# Patient Record
Sex: Female | Born: 1945 | Race: White | Hispanic: No | Marital: Married | Smoking: Never smoker
Health system: Southern US, Community
[De-identification: ages and names within clinical notes are randomized; demographics above are authoritative.]

## PROBLEM LIST (undated history)

## (undated) DIAGNOSIS — C801 Malignant (primary) neoplasm, unspecified: Secondary | ICD-10-CM

## (undated) DIAGNOSIS — M1990 Unspecified osteoarthritis, unspecified site: Secondary | ICD-10-CM

## (undated) HISTORY — PX: COLONOSCOPY: SHX174

## (undated) HISTORY — PX: BREAST BIOPSY: SHX20

---

## 2004-08-16 ENCOUNTER — Ambulatory Visit: Payer: Self-pay | Admitting: Family Medicine

## 2004-09-02 ENCOUNTER — Ambulatory Visit: Payer: Self-pay | Admitting: Gastroenterology

## 2006-01-09 ENCOUNTER — Ambulatory Visit: Payer: Self-pay | Admitting: Pediatrics

## 2007-09-17 ENCOUNTER — Ambulatory Visit: Payer: Self-pay | Admitting: Family Medicine

## 2007-09-23 ENCOUNTER — Ambulatory Visit: Payer: Self-pay | Admitting: Family Medicine

## 2009-07-26 ENCOUNTER — Ambulatory Visit: Payer: Self-pay | Admitting: Family Medicine

## 2009-09-28 ENCOUNTER — Ambulatory Visit: Payer: Self-pay | Admitting: Gastroenterology

## 2011-06-07 ENCOUNTER — Ambulatory Visit: Payer: Self-pay | Admitting: Family Medicine

## 2012-09-24 ENCOUNTER — Ambulatory Visit: Payer: Self-pay | Admitting: Family Medicine

## 2012-11-20 ENCOUNTER — Ambulatory Visit: Payer: Self-pay | Admitting: Gastroenterology

## 2012-11-28 ENCOUNTER — Ambulatory Visit: Payer: Self-pay | Admitting: Family Medicine

## 2013-11-28 ENCOUNTER — Ambulatory Visit: Payer: Self-pay | Admitting: Family Medicine

## 2013-12-03 ENCOUNTER — Ambulatory Visit: Payer: Self-pay | Admitting: Family Medicine

## 2015-11-09 ENCOUNTER — Other Ambulatory Visit: Payer: Self-pay | Admitting: Family Medicine

## 2015-11-09 DIAGNOSIS — Z139 Encounter for screening, unspecified: Secondary | ICD-10-CM

## 2015-11-09 DIAGNOSIS — Z13820 Encounter for screening for osteoporosis: Secondary | ICD-10-CM

## 2015-12-02 ENCOUNTER — Ambulatory Visit
Admission: RE | Admit: 2015-12-02 | Discharge: 2015-12-02 | Disposition: A | Discharge status: Home / Self Care | Payer: Medicare Other | Source: Ambulatory Visit | Attending: Family Medicine | Admitting: Family Medicine

## 2015-12-02 ENCOUNTER — Other Ambulatory Visit: Payer: Self-pay | Admitting: Family Medicine

## 2015-12-02 DIAGNOSIS — Z13820 Encounter for screening for osteoporosis: Secondary | ICD-10-CM | POA: Diagnosis present

## 2015-12-02 DIAGNOSIS — Z1231 Encounter for screening mammogram for malignant neoplasm of breast: Secondary | ICD-10-CM | POA: Insufficient documentation

## 2015-12-02 DIAGNOSIS — Z139 Encounter for screening, unspecified: Secondary | ICD-10-CM

## 2015-12-02 HISTORY — DX: Malignant (primary) neoplasm, unspecified: C80.1

## 2017-03-29 ENCOUNTER — Other Ambulatory Visit: Payer: Self-pay | Admitting: Family Medicine

## 2017-03-29 DIAGNOSIS — Z1231 Encounter for screening mammogram for malignant neoplasm of breast: Secondary | ICD-10-CM

## 2017-04-13 ENCOUNTER — Ambulatory Visit
Admission: RE | Admit: 2017-04-13 | Discharge: 2017-04-13 | Disposition: A | Discharge status: Home / Self Care | Payer: Medicare Other | Source: Ambulatory Visit | Attending: Family Medicine | Admitting: Family Medicine

## 2017-04-13 DIAGNOSIS — Z1231 Encounter for screening mammogram for malignant neoplasm of breast: Secondary | ICD-10-CM | POA: Diagnosis present

## 2017-04-13 DIAGNOSIS — R928 Other abnormal and inconclusive findings on diagnostic imaging of breast: Secondary | ICD-10-CM | POA: Insufficient documentation

## 2017-04-17 ENCOUNTER — Other Ambulatory Visit: Payer: Self-pay | Admitting: Family Medicine

## 2017-04-17 DIAGNOSIS — R928 Other abnormal and inconclusive findings on diagnostic imaging of breast: Secondary | ICD-10-CM

## 2017-04-24 ENCOUNTER — Ambulatory Visit
Admission: RE | Admit: 2017-04-24 | Discharge: 2017-04-24 | Disposition: A | Discharge status: Home / Self Care | Payer: Medicare Other | Source: Ambulatory Visit | Attending: Family Medicine | Admitting: Family Medicine

## 2017-04-24 DIAGNOSIS — R928 Other abnormal and inconclusive findings on diagnostic imaging of breast: Secondary | ICD-10-CM

## 2017-05-09 ENCOUNTER — Encounter
Admission: RE | Admit: 2017-05-09 | Discharge: 2017-05-09 | Disposition: A | Discharge status: Home / Self Care | Payer: Medicare Other | Source: Ambulatory Visit | Attending: Obstetrics & Gynecology | Admitting: Obstetrics & Gynecology

## 2017-05-09 HISTORY — DX: Unspecified osteoarthritis, unspecified site: M19.90

## 2017-05-09 NOTE — Patient Instructions (Addendum)
  Your procedure is scheduled on: 05-11-17 FRIDAY Report to Same Day Surgery 2nd floor medical mall [facility] Entrance-take elevator on left to 2nd floor.  Check in with surgery information desk.) To find out your arrival time please call [phone] between 1PM - 3PM on 05-10-17 THURSDAY  Remember: Instructions that are not followed completely may result in serious medical risk, up to and including death, or upon the discretion of your surgeon and anesthesiologist your surgery may need to be rescheduled.    _x___ 1. Do not eat food after midnight the night before your procedure. NO GUM CHEWING OR HARD CANDIES.  You may drink clear liquids up to 2 hours before you are scheduled to arrive at the hospital for your procedure.  Do not drink clear liquids within 2 hours of your scheduled arrival to the hospital.  Clear liquids include  --Water or Apple juice without pulp  --Clear carbohydrate beverage such as ClearFast or Gatorade  --Black Coffee or Clear Tea (No milk, no creamers, do not add anything to the coffee or Tea)  Type 1 and type 2 diabetics should only drink water.     __x__ 2. No Alcohol for 24 hours before or after surgery.   __x__3. No Smoking for 24 prior to surgery.   ____  4. Bring all medications with you on the day of surgery if instructed.    __x__ 5. Notify your doctor if there is any change in your medical condition     (cold, fever, infections).     Do not wear jewelry, make-up, hairpins, clips or nail polish.  Do not wear lotions, powders, or perfumes. You may wear deodorant.  Do not shave 48 hours prior to surgery. Men may shave face and neck.  Do not bring valuables to the hospital.    [facility] is not responsible for any belongings or valuables.               Contacts, dentures or bridgework may not be worn into surgery.  Leave your suitcase in the car. After surgery it may be brought to your room.  For patients admitted to the hospital, discharge time is  determined by your treatment team.   Patients discharged the day of surgery will not be allowed to drive home.  You will need someone to drive you home and stay with you the night of your procedure.    Please read over the following fact sheets that you were given:   [facility] Preparing for Surgery and or MRSA Information   ____ Take anti-hypertensive listed below, cardiac, seizure, asthma, anti-reflux and psychiatric medicines. These include:  1. NONE  2.  3.  4.  5.  6.  ____Fleets enema or Magnesium Citrate as directed.   _x___ Use CHG Soap or sage wipes as directed on instruction sheet   ____ Use inhalers on the day of surgery and bring to hospital day of surgery  ____ Stop Metformin and Janumet 2 days prior to surgery.    ____ Take 1/2 of usual insulin dose the night before surgery and none on the morning surgery.   _x___ Follow recommendations from Cardiologist, Pulmonologist or PCP regarding stopping Aspirin, Coumadin, Plavix ,Eliquis, Effient, or Pradaxa, and Pletal-STOP ASPIRIN NOW  X____Stop Anti-inflammatories such as Advil, Aleve, Ibuprofen, Motrin, Naproxen, Naprosyn, Goodies powders or aspirin products NOW-OK to take Tylenol   ____ Stop supplements until after surgery.     ____ Bring C-Pap to the hospital.

## 2017-05-10 ENCOUNTER — Encounter
Admission: RE | Admit: 2017-05-10 | Discharge: 2017-05-10 | Disposition: A | Discharge status: Home / Self Care | Payer: Medicare Other | Source: Ambulatory Visit | Attending: Obstetrics & Gynecology | Admitting: Obstetrics & Gynecology

## 2017-05-10 DIAGNOSIS — Z7982 Long term (current) use of aspirin: Secondary | ICD-10-CM | POA: Diagnosis not present

## 2017-05-10 DIAGNOSIS — N950 Postmenopausal bleeding: Secondary | ICD-10-CM | POA: Diagnosis not present

## 2017-05-10 DIAGNOSIS — N854 Malposition of uterus: Secondary | ICD-10-CM | POA: Diagnosis not present

## 2017-05-10 DIAGNOSIS — E669 Obesity, unspecified: Secondary | ICD-10-CM | POA: Diagnosis not present

## 2017-05-10 DIAGNOSIS — Z803 Family history of malignant neoplasm of breast: Secondary | ICD-10-CM | POA: Diagnosis not present

## 2017-05-10 DIAGNOSIS — R9389 Abnormal findings on diagnostic imaging of other specified body structures: Secondary | ICD-10-CM | POA: Diagnosis present

## 2017-05-10 DIAGNOSIS — Z833 Family history of diabetes mellitus: Secondary | ICD-10-CM | POA: Diagnosis not present

## 2017-05-10 DIAGNOSIS — E7800 Pure hypercholesterolemia, unspecified: Secondary | ICD-10-CM | POA: Diagnosis not present

## 2017-05-10 DIAGNOSIS — Z6835 Body mass index (BMI) 35.0-35.9, adult: Secondary | ICD-10-CM | POA: Diagnosis not present

## 2017-05-10 DIAGNOSIS — M1990 Unspecified osteoarthritis, unspecified site: Secondary | ICD-10-CM | POA: Diagnosis not present

## 2017-05-10 DIAGNOSIS — Z79899 Other long term (current) drug therapy: Secondary | ICD-10-CM | POA: Diagnosis not present

## 2017-05-10 DIAGNOSIS — Z823 Family history of stroke: Secondary | ICD-10-CM | POA: Diagnosis not present

## 2017-05-10 DIAGNOSIS — N840 Polyp of corpus uteri: Secondary | ICD-10-CM | POA: Diagnosis not present

## 2017-05-10 LAB — TYPE AND SCREEN
ABO/RH(D): A POS
ANTIBODY SCREEN: NEGATIVE

## 2017-05-10 LAB — BASIC METABOLIC PANEL
ANION GAP: 8 (ref 5–15)
BUN: 19 mg/dL (ref 6–20)
CALCIUM: 8.9 mg/dL (ref 8.9–10.3)
CO2: 27 mmol/L (ref 22–32)
CREATININE: 0.82 mg/dL (ref 0.44–1.00)
Chloride: 107 mmol/L (ref 101–111)
Glucose, Bld: 96 mg/dL (ref 65–99)
Potassium: 3.9 mmol/L (ref 3.5–5.1)
Sodium: 142 mmol/L (ref 135–145)

## 2017-05-10 LAB — CBC
HCT: 42.6 % (ref 35.0–47.0)
HEMOGLOBIN: 14.5 g/dL (ref 12.0–16.0)
MCH: 29.3 pg (ref 26.0–34.0)
MCHC: 34 g/dL (ref 32.0–36.0)
MCV: 86.2 fL (ref 80.0–100.0)
PLATELETS: 208 10*3/uL (ref 150–440)
RBC: 4.95 MIL/uL (ref 3.80–5.20)
RDW: 13.7 % (ref 11.5–14.5)
WBC: 5.7 10*3/uL (ref 3.6–11.0)

## 2017-05-11 ENCOUNTER — Ambulatory Visit: Payer: Medicare Other | Admitting: Anesthesiology

## 2017-05-11 ENCOUNTER — Encounter
Admission: RE | Disposition: A | Discharge status: Home / Self Care | Payer: Self-pay | Source: Ambulatory Visit | Attending: Obstetrics & Gynecology

## 2017-05-11 ENCOUNTER — Ambulatory Visit
Admission: RE | Admit: 2017-05-11 | Discharge: 2017-05-11 | Disposition: A | Discharge status: Home / Self Care | Payer: Medicare Other | Source: Ambulatory Visit | Attending: Obstetrics & Gynecology | Admitting: Obstetrics & Gynecology

## 2017-05-11 ENCOUNTER — Encounter: Payer: Self-pay | Admitting: *Deleted

## 2017-05-11 DIAGNOSIS — Z79899 Other long term (current) drug therapy: Secondary | ICD-10-CM | POA: Insufficient documentation

## 2017-05-11 DIAGNOSIS — N840 Polyp of corpus uteri: Secondary | ICD-10-CM | POA: Diagnosis not present

## 2017-05-11 DIAGNOSIS — Z803 Family history of malignant neoplasm of breast: Secondary | ICD-10-CM | POA: Insufficient documentation

## 2017-05-11 DIAGNOSIS — Z833 Family history of diabetes mellitus: Secondary | ICD-10-CM | POA: Insufficient documentation

## 2017-05-11 DIAGNOSIS — N854 Malposition of uterus: Secondary | ICD-10-CM | POA: Insufficient documentation

## 2017-05-11 DIAGNOSIS — M1990 Unspecified osteoarthritis, unspecified site: Secondary | ICD-10-CM | POA: Insufficient documentation

## 2017-05-11 DIAGNOSIS — Z6835 Body mass index (BMI) 35.0-35.9, adult: Secondary | ICD-10-CM | POA: Insufficient documentation

## 2017-05-11 DIAGNOSIS — Z7982 Long term (current) use of aspirin: Secondary | ICD-10-CM | POA: Insufficient documentation

## 2017-05-11 DIAGNOSIS — E669 Obesity, unspecified: Secondary | ICD-10-CM | POA: Insufficient documentation

## 2017-05-11 DIAGNOSIS — E7800 Pure hypercholesterolemia, unspecified: Secondary | ICD-10-CM | POA: Insufficient documentation

## 2017-05-11 DIAGNOSIS — N950 Postmenopausal bleeding: Secondary | ICD-10-CM | POA: Insufficient documentation

## 2017-05-11 DIAGNOSIS — Z823 Family history of stroke: Secondary | ICD-10-CM | POA: Insufficient documentation

## 2017-05-11 HISTORY — PX: HYSTEROSCOPY WITH D & C: SHX1775

## 2017-05-11 SURGERY — DILATATION AND CURETTAGE /HYSTEROSCOPY
Anesthesia: General | Wound class: Clean Contaminated

## 2017-05-11 MED ORDER — PROPOFOL 10 MG/ML IV BOLUS
INTRAVENOUS | Status: AC
  Filled 2017-05-11: qty 20

## 2017-05-11 MED ORDER — FAMOTIDINE 20 MG PO TABS
ORAL_TABLET | ORAL | Status: AC
  Administered 2017-05-11: 20 mg via ORAL
  Filled 2017-05-11: qty 1

## 2017-05-11 MED ORDER — LACTATED RINGERS IV SOLN
INTRAVENOUS | Status: DC
  Administered 2017-05-11: 07:00:00 via INTRAVENOUS

## 2017-05-11 MED ORDER — DEXAMETHASONE SODIUM PHOSPHATE 10 MG/ML IJ SOLN
INTRAMUSCULAR | Status: AC
  Filled 2017-05-11: qty 2

## 2017-05-11 MED ORDER — GLYCOPYRROLATE 0.2 MG/ML IJ SOLN
INTRAMUSCULAR | Status: DC | PRN
  Administered 2017-05-11: 0.2 mg via INTRAVENOUS

## 2017-05-11 MED ORDER — DEXAMETHASONE SODIUM PHOSPHATE 10 MG/ML IJ SOLN
INTRAMUSCULAR | Status: DC | PRN
  Administered 2017-05-11: 10 mg via INTRAVENOUS

## 2017-05-11 MED ORDER — LACTATED RINGERS IV SOLN
INTRAVENOUS | Status: DC | PRN
  Administered 2017-05-11: 07:00:00 via INTRAVENOUS

## 2017-05-11 MED ORDER — ONDANSETRON HCL 4 MG/2ML IJ SOLN
INTRAMUSCULAR | Status: AC
  Filled 2017-05-11: qty 4

## 2017-05-11 MED ORDER — FENTANYL CITRATE (PF) 100 MCG/2ML IJ SOLN
INTRAMUSCULAR | Status: AC
  Filled 2017-05-11: qty 2

## 2017-05-11 MED ORDER — SILVER NITRATE-POT NITRATE 75-25 % EX MISC
CUTANEOUS | Status: DC | PRN
  Administered 2017-05-11: 4

## 2017-05-11 MED ORDER — FENTANYL CITRATE (PF) 100 MCG/2ML IJ SOLN
INTRAMUSCULAR | Status: DC | PRN
  Administered 2017-05-11 (×4): 25 ug via INTRAVENOUS

## 2017-05-11 MED ORDER — SILVER NITRATE-POT NITRATE 75-25 % EX MISC
CUTANEOUS | Status: AC
  Filled 2017-05-11: qty 3

## 2017-05-11 MED ORDER — LIDOCAINE HCL (CARDIAC) 20 MG/ML IV SOLN
INTRAVENOUS | Status: DC | PRN
  Administered 2017-05-11: 100 mg via INTRAVENOUS

## 2017-05-11 MED ORDER — ONDANSETRON HCL 4 MG/2ML IJ SOLN: 4.0000 mg | Freq: Once | INTRAMUSCULAR | Status: DC | PRN

## 2017-05-11 MED ORDER — FENTANYL CITRATE (PF) 100 MCG/2ML IJ SOLN: 25.0000 ug | INTRAMUSCULAR | Status: DC | PRN

## 2017-05-11 MED ORDER — PROPOFOL 10 MG/ML IV BOLUS
INTRAVENOUS | Status: DC | PRN
  Administered 2017-05-11: 20 mg via INTRAVENOUS
  Administered 2017-05-11: 150 mg via INTRAVENOUS

## 2017-05-11 MED ORDER — ONDANSETRON HCL 4 MG/2ML IJ SOLN
INTRAMUSCULAR | Status: DC | PRN
  Administered 2017-05-11: 4 mg via INTRAVENOUS

## 2017-05-11 MED ORDER — FAMOTIDINE 20 MG PO TABS
20.0000 mg | ORAL_TABLET | Freq: Once | ORAL | Status: AC
  Administered 2017-05-11: 20 mg via ORAL

## 2017-05-11 SURGICAL SUPPLY — 17 items
BAG INFUSER PRESSURE 100CC (MISCELLANEOUS) ×3 IMPLANT
ELECT REM PT RETURN 9FT ADLT (ELECTROSURGICAL) ×3
ELECTRODE REM PT RTRN 9FT ADLT (ELECTROSURGICAL) ×1 IMPLANT
GLOVE PI ORTHOPRO 6.5 (GLOVE) ×2
GLOVE PI ORTHOPRO STRL 6.5 (GLOVE) ×1 IMPLANT
GLOVE SURG SYN 6.5 ES PF (GLOVE) ×3 IMPLANT
GOWN STRL REUS W/ TWL LRG LVL3 (GOWN DISPOSABLE) ×2 IMPLANT
GOWN STRL REUS W/TWL LRG LVL3 (GOWN DISPOSABLE) ×4
IV LACTATED RINGERS 1000ML (IV SOLUTION) ×3 IMPLANT
KIT RM TURNOVER CYSTO AR (KITS) ×3 IMPLANT
NEEDLE SPNL 22GX3.5 QUINCKE BK (NEEDLE) ×3 IMPLANT
PACK DNC HYST (MISCELLANEOUS) ×3 IMPLANT
PAD OB MATERNITY 4.3X12.25 (PERSONAL CARE ITEMS) ×3 IMPLANT
PAD PREP 24X41 OB/GYN DISP (PERSONAL CARE ITEMS) ×3 IMPLANT
SYRINGE 10CC LL (SYRINGE) ×3 IMPLANT
TUBING CONNECTING 10 (TUBING) ×2 IMPLANT
TUBING CONNECTING 10' (TUBING) ×1

## 2017-05-11 NOTE — Interval H&P Note (Signed)
History and Physical Interval Note:  05/11/2017 6:47 AM  Karen Cardenas  has presented today for surgery, with the diagnosis of Endometrial Thickening  PMP  The various methods of treatment have been discussed with the patient and family. After consideration of risks, benefits and other options for treatment, the patient has consented to  Procedure(s): DILATATION AND CURETTAGE /HYSTEROSCOPY (N/A) as a surgical intervention .  The patient's history has been reviewed, patient examined, no change in status, stable for surgery.  I have reviewed the patient's chart and labs.  Questions were answered to the patient's satisfaction.     [place]

## 2017-05-11 NOTE — Discharge Instructions (Signed)
You should expect to have some cramping and vaginal bleeding for about a week. This should taper off and subside, much like a period. If heavy bleeding continues or gets worse, you should contact the office for an earlier appointment.   Please call the office or physician on call for fever >101, severe pain, and heavy bleeding.   [place]!!     AMBULATORY SURGERY  DISCHARGE INSTRUCTIONS  1) The drugs that you were given will stay in your system until tomorrow so for the next 24 hours you should not: A) Drive an automobile B) Make any legal decisions C) Drink any alcoholic beverage  2) You may resume regular meals tomorrow.  Today it is better to start with liquids and gradually work up to solid foods. You may eat anything you prefer, but it is better to start with liquids, then soup and crackers, and gradually work up to solid foods.  3) Please notify your doctor immediately if you have any unusual bleeding, trouble breathing, redness and pain at the surgery site, drainage, fever, or pain not relieved by medication.  4) Additional Instructions:  Please contact your physician with any problems or Same Day Surgery at [phone], Monday through Friday 6 am to 4 pm, or [place] [facility] number at [phone].

## 2017-05-11 NOTE — Op Note (Signed)
Operative Report Hysteroscopy, Dilation and Curettage 05/11/2017  Patient:  Karen Cardenas  71 y.o. female Preoperative diagnosis:  Endometrial Thickening  PMB Postoperative diagnosis:  Endometrial Thickening; PMB; endometrial polyp   PROCEDURE:  Procedure(s): DILATATION AND CURETTAGE /HYSTEROSCOPY WITH ENDOMETRIAL POLYPECTOMY (N/A) Surgeon:  Surgeon(s) and Role:    * Kinsey Cowsert, Honor Loh, MD - Primary Anesthesia:  LMA I/O: Total I/O In: 600 [I.V.:600] Out: - 25cc EBL, 0 UOP Specimens:  Endometrial curettings, endometrial polyp Complications: None Apparent Disposition:  VS stable to PACU  Findings: Uterus, mobile, normal size, sounding to 7 cm; normal cervix, vagina, perineum.  Isolated polyp in the upper right fundus.  Atrophic endometrium otherwise.  Indication for procedure/Consents: 71 y.o.   here for scheduled surgery for the aforementioned diagnoses. Risks of surgery were discussed with the patient including but not limited to: bleeding which may require transfusion; infection which may require antibiotics; injury to uterus or surrounding organs; need for additional procedures including laparotomy or laparoscopy; and other postoperative/anesthesia complications. Written informed consent was obtained.    Procedure Details:   The patient was then taken to the operating room where anesthesia was administered and was found to be adequate.  After a formal and adequate timeout was performed, she was placed in the dorsal lithotomy position and examined with the above findings. She was then prepped and draped in the sterile manner.  A speculum was then placed in the patient's vagina and a single tooth tenaculum was applied to the posterior lip of the cervix.   The uterus was sounded to 7cm. Her cervix was serially dilated to accommodate the myoscope, with findings as above. The uterine forceps were placed inside and grasped the polyp for a polypectomy. A sharp curettage was then performed until  there was a gritty texture in all four quadrants. The specimens were handed off to nursing.  The camera was reinserted and confirmed the uterus had been evacuated. The tenaculum was removed from the cervix, with silver nitrate applied to the right tenaculum site, and the vaginal speculum was removed after noting good hemostasis. The patient tolerated the procedure well and was taken to the recovery area awake, extubated and in stable condition.  The patient will be discharged to home as per PACU criteria.  Routine postoperative instructions given. She will follow up in the clinic in two to four weeks for postoperative evaluation.  Larey Days, MD [facility] Attending Gynecologist

## 2017-05-11 NOTE — Anesthesia Postprocedure Evaluation (Signed)
Anesthesia Post Note  Patient: Karen Cardenas  Procedure(s) Performed: DILATATION AND CURETTAGE /HYSTEROSCOPY WITH ENDOMETRIAL POLYPECTOMY (N/A )  Patient location during evaluation: PACU Anesthesia Type: General Level of consciousness: awake and alert Pain management: pain level controlled Vital Signs Assessment: post-procedure vital signs reviewed and stable Respiratory status: spontaneous breathing, nonlabored ventilation, respiratory function stable and patient connected to nasal cannula oxygen Cardiovascular status: blood pressure returned to baseline and stable Postop Assessment: no apparent nausea or vomiting Anesthetic complications: no     Last Vitals:  Vitals:   05/11/17 0825 05/11/17 0831  BP: 122/82   Pulse: 76 76  Resp: 12 13  Temp:    SpO2: 100% 100%    Last Pain:  Vitals:   05/11/17 0831  TempSrc:   PainSc: 0-No pain                 [name] S

## 2017-05-11 NOTE — Anesthesia Preprocedure Evaluation (Signed)
Anesthesia Evaluation  Patient identified by MRN, date of birth, ID band Patient awake    Reviewed: Allergy & Precautions, NPO status , Patient's Chart, lab work & pertinent test results, reviewed documented beta blocker date and time   Airway Mallampati: III  TM Distance: >3 FB     Dental  (+) Chipped   Pulmonary           Cardiovascular      Neuro/Psych    GI/Hepatic   Endo/Other    Renal/GU      Musculoskeletal  (+) Arthritis ,   Abdominal   Peds  Hematology   Anesthesia Other Findings Obese.  Reproductive/Obstetrics                             Anesthesia Physical Anesthesia Plan  ASA: III  Anesthesia Plan: General   Post-op Pain Management:    Induction: Intravenous  PONV Risk Score and Plan:   Airway Management Planned: LMA  Additional Equipment:   Intra-op Plan:   Post-operative Plan:   Informed Consent: I have reviewed the patients History and Physical, chart, labs and discussed the procedure including the risks, benefits and alternatives for the proposed anesthesia with the patient or authorized representative who has indicated his/her understanding and acceptance.     Plan Discussed with: CRNA  Anesthesia Plan Comments:         Anesthesia Quick Evaluation

## 2017-05-11 NOTE — Anesthesia Post-op Follow-up Note (Signed)
Anesthesia QCDR form completed.        

## 2017-05-11 NOTE — Transfer of Care (Signed)
Immediate Anesthesia Transfer of Care Note  Patient: STEPHANI JANAK  Procedure(s) Performed: DILATATION AND CURETTAGE /HYSTEROSCOPY WITH ENDOMETRIAL POLYPECTOMY (N/A )  Patient Location: PACU  Anesthesia Type:General  Level of Consciousness: awake  Airway & Oxygen Therapy: Patient Spontanous Breathing  Post-op Assessment: Report given to RN  Post vital signs: Reviewed  Last Vitals:  Vitals:   05/11/17 0810 05/11/17 0816  BP: 125/78   Pulse: 84 80  Resp: (!) 6 11  Temp: 36.4 C   SpO2: 100% 100%    Last Pain:  Vitals:   05/11/17 0810  TempSrc:   PainSc: Asleep         Complications: No apparent anesthesia complications

## 2017-05-11 NOTE — Anesthesia Procedure Notes (Signed)
Procedure Name: LMA Insertion Date/Time: 05/11/2017 7:32 AM Performed by: Timoteo Expose Pre-anesthesia Checklist: Emergency Drugs available, Patient identified, Suction available, Patient being monitored and Timeout performed Patient Re-evaluated:Patient Re-evaluated prior to induction Oxygen Delivery Method: Circle system utilized Preoxygenation: Pre-oxygenation with 100% oxygen Induction Type: IV induction LMA: LMA inserted LMA Size: 4.0

## 2017-05-11 NOTE — H&P (Signed)
  Chief Complaint:   Patient ID: Karen Cardenas is a 71 y.o. female presenting with Pre Op Consulting on 05/08/2017  HPI: Karen Cardenas presented with post menopausal bleeding. First episode, about 3 weeks of bleeding and has now subsided.   Transvaginal ultrasound showed anteverted uterus with endometrial thickness of 16.58mm 1.8cm right posterior fibroid 2.9cm left anterior fibroid  Bilateral ovaries were WNL  Past Medical History: has a past medical history of Adenomatous polyp; Hypercholesteremia; and Seasonal allergies.  Past Surgical History: has a past surgical history that includes Colonoscopy. Family History: family history includes Breast cancer (age of onset: 66) in her mother; Diabetes in her father; Other in her father; Stroke in her father. Social History: reports that she has never smoked. She has never used smokeless tobacco. She reports that she does not drink alcohol or use drugs. OB/GYN History:  OB History  Gravida Para Term Preterm AB Living  1 1 1 1   SAB TAB Ectopic Molar Multiple Live Births  1    Allergies: has No Known Allergies. Medications:  Current Outpatient Prescriptions:  . aspirin 81 MG EC tablet, Take 162 mg by mouth once daily. , Disp: , Rfl:  . cetirizine (ZYRTEC) 10 MG tablet, Take 10 mg by mouth once daily., Disp: , Rfl:   Review of Systems: No SOB, no palpitations or chest pain, no new lower extremity edema, no nausea or vomiting or bowel or bladder complaints. See HPI for gyn specific ROS.  Exam:    BP 132/80  Pulse 56  Ht 163.8 cm (5' 4.5")  Wt 94.3 kg (207 lb 12.8 oz)  BMI 35.12 kg/m   General: Patient is well-groomed, well-nourished, appears stated age in no acute distress  HEENT: head is atraumatic and normocephalic, trachea is midline, neck is supple with no palpable nodules  CV: Regular rhythm and normal heart rate, no murmur  Pulm: Clear to auscultation throughout lung fields with no wheezing, crackles, or rhonchi. No  increased work of breathing  Abdomen: soft , no mass, non-tender, no rebound tenderness, no hepatomegaly  Pelvic: tanner stage 5 ,  External genitalia: vulva /labia no lesions Urethra: no prolapse Vagina: normal physiologic d/c, laxity in vaginal walls Cervix: no lesions, no cervical motion tenderness, good descent Uterus: normal size shape and contour, non-tender Adnexa: no mass, non-tender  Rectovaginal: External wnl  Impression:   The primary encounter diagnosis was Post-menopausal bleeding. Diagnoses of Thickened endometrium and Preoperative exam for gynecologic surgery were also pertinent to this visit.  Plan:   Procedure: Hysteroscopy D&C  The patient and I discussed the technical aspects of the procedure including the potential for risks and complications.These include but are not limited to the risk of infection requiring post-operative antibiotics or further procedures.We talked about the risk of injury to adjacent organs including bladder, bowel, ureter, blood vessels or nerves, uterine perforation, the need to convert to laparoscopic or open incision, possibleneed for blood transfusion andpostop complications such asthromboembolic or cardiopulmonary complications.All of her questions were answered. Her preoperative exam was completed andthe appropriate consents were signed. She is scheduled to undergo this procedure on 05/11/17.    ----- Larey Days, MD Attending Obstetrician and Gynecologist [facility], Department of [facility]

## 2017-05-14 LAB — SURGICAL PATHOLOGY

## 2017-09-18 ENCOUNTER — Encounter
Admission: RE | Admit: 2017-09-18 | Discharge: 2017-09-18 | Disposition: A | Discharge status: Home / Self Care | Payer: Medicare Other | Source: Ambulatory Visit | Attending: Otolaryngology | Admitting: Otolaryngology

## 2017-09-18 ENCOUNTER — Other Ambulatory Visit: Payer: Self-pay

## 2017-09-18 NOTE — Patient Instructions (Signed)
Your procedure is scheduled on: Thurs 09/27/17 Report to Day Surgery. To find out your arrival time please call [phone] between [place] on Wed. 09/26/17.  Remember: Instructions that are not followed completely may result in serious medical risk, up to and including death, or upon the discretion of your surgeon and anesthesiologist your surgery may need to be rescheduled.     _X__ 1. Do not eat food after midnight the night before your procedure.                 No gum chewing or hard candies. You may drink clear liquids up to 2 hours                 before you are scheduled to arrive for your surgery- DO not drink clear                 liquids within 2 hours of the start of your surgery.                 Clear Liquids include:  water, apple juice without pulp, clear carbohydrate                 drink such as Clearfast of Gartorade, Black Coffee or Tea (Do not add                 anything to coffee or tea).  __X__2.  On the morning of surgery brush your teeth with toothpaste and water, you                 may rinse your mouth with mouthwash if you wish.  Do not swallow any              toothpaste of mouthwash.     _X__ 3.  No Alcohol for 24 hours before or after surgery.   _X__ 4.  Do Not Smoke or use e-cigarettes For 24 Hours Prior to Your Surgery.                 Do not use any chewable tobacco products for at least 6 hours prior to                 surgery.  ____  5.  Bring all medications with you on the day of surgery if instructed.   _x___  6.  Notify your doctor if there is any change in your medical condition      (cold, fever, infections).     Do not wear jewelry, make-up, hairpins, clips or nail polish. Do not wear lotions, powders, or perfumes. You may wear deodorant. Do not shave 48 hours prior to surgery. Men may shave face and neck. Do not bring valuables to the hospital.    [facility] is not responsible for any belongings or  valuables.  Contacts, dentures or bridgework may not be worn into surgery. Leave your suitcase in the car. After surgery it may be brought to your room. For patients admitted to the hospital, discharge time is determined by your treatment team.   Patients discharged the day of surgery will not be allowed to drive home.   Please read over the following fact sheets that you were given:    __x__ Take these medicines the morning of surgery with A SIP OF WATER:    1. acetaminophen (TYLENOL) 325 MG tablet if needed  2. cetirizine (ZYRTEC) 10 MG tablet if needed  3.   4.  5.  6.  ____ Fleet Enema (as directed)   ____ Use CHG Soap as directed  ____ Use inhalers on the day of surgery  ____ Stop metformin 2 days prior to surgery    ____ Take 1/2 of usual insulin dose the night before surgery. No insulin the morning          of surgery.   __x__ Stop aspirin as instructed per MD  __x__ Stop Anti-inflammatories as instructed per MD   ____ Stop supplements until after surgery.    ____ Bring C-Pap to the hospital.

## 2017-09-27 ENCOUNTER — Encounter
Admission: RE | Disposition: A | Discharge status: Home / Self Care | Payer: Self-pay | Source: Ambulatory Visit | Attending: Otolaryngology

## 2017-09-27 ENCOUNTER — Encounter: Payer: Self-pay | Admitting: *Deleted

## 2017-09-27 ENCOUNTER — Ambulatory Visit: Payer: Medicare Other | Admitting: Anesthesiology

## 2017-09-27 ENCOUNTER — Ambulatory Visit
Admission: RE | Admit: 2017-09-27 | Discharge: 2017-09-27 | Disposition: A | Discharge status: Home / Self Care | Payer: Medicare Other | Source: Ambulatory Visit | Attending: Otolaryngology | Admitting: Otolaryngology

## 2017-09-27 ENCOUNTER — Other Ambulatory Visit: Payer: Self-pay

## 2017-09-27 DIAGNOSIS — Z7982 Long term (current) use of aspirin: Secondary | ICD-10-CM | POA: Diagnosis not present

## 2017-09-27 DIAGNOSIS — H6980 Other specified disorders of Eustachian tube, unspecified ear: Secondary | ICD-10-CM | POA: Diagnosis not present

## 2017-09-27 DIAGNOSIS — Z79899 Other long term (current) drug therapy: Secondary | ICD-10-CM | POA: Insufficient documentation

## 2017-09-27 DIAGNOSIS — J329 Chronic sinusitis, unspecified: Secondary | ICD-10-CM | POA: Insufficient documentation

## 2017-09-27 DIAGNOSIS — H6690 Otitis media, unspecified, unspecified ear: Secondary | ICD-10-CM | POA: Insufficient documentation

## 2017-09-27 HISTORY — PX: MYRINGOTOMY WITH TUBE PLACEMENT: SHX5663

## 2017-09-27 HISTORY — PX: ETHMOIDECTOMY: SHX5197

## 2017-09-27 HISTORY — PX: FRONTAL SINUS EXPLORATION: SHX6591

## 2017-09-27 HISTORY — PX: MAXILLARY ANTROSTOMY: SHX2003

## 2017-09-27 HISTORY — PX: IMAGE GUIDED SINUS SURGERY: SHX6570

## 2017-09-27 SURGERY — SINUS SURGERY, WITH IMAGING GUIDANCE
Anesthesia: General

## 2017-09-27 MED ORDER — DEXMEDETOMIDINE HCL IN NACL 200 MCG/50ML IV SOLN
INTRAVENOUS | Status: DC | PRN
  Administered 2017-09-27: 16 ug via INTRAVENOUS

## 2017-09-27 MED ORDER — ACETAMINOPHEN 160 MG/5ML PO SOLN
325.0000 mg | ORAL | Status: DC | PRN
  Filled 2017-09-27: qty 20.3

## 2017-09-27 MED ORDER — LIDOCAINE-EPINEPHRINE 1 %-1:100000 IJ SOLN
INTRAMUSCULAR | Status: DC | PRN
  Administered 2017-09-27: 5 mL

## 2017-09-27 MED ORDER — BACITRACIN ZINC 500 UNIT/GM EX OINT
TOPICAL_OINTMENT | CUTANEOUS | Status: AC
  Filled 2017-09-27: qty 28.35

## 2017-09-27 MED ORDER — ONDANSETRON HCL 4 MG/2ML IJ SOLN
INTRAMUSCULAR | Status: AC
  Filled 2017-09-27: qty 2

## 2017-09-27 MED ORDER — CIPROFLOXACIN-DEXAMETHASONE 0.3-0.1 % OT SUSP
OTIC | Status: DC | PRN
  Administered 2017-09-27: 4 [drp] via OTIC

## 2017-09-27 MED ORDER — PROPOFOL 10 MG/ML IV BOLUS
INTRAVENOUS | Status: DC | PRN
  Administered 2017-09-27: 130 mg via INTRAVENOUS
  Administered 2017-09-27: 60 mg via INTRAVENOUS

## 2017-09-27 MED ORDER — SUCCINYLCHOLINE CHLORIDE 20 MG/ML IJ SOLN
INTRAMUSCULAR | Status: DC | PRN
  Administered 2017-09-27: 120 mg via INTRAVENOUS

## 2017-09-27 MED ORDER — HYDROCODONE-ACETAMINOPHEN 7.5-325 MG PO TABS
1.0000 | ORAL_TABLET | Freq: Once | ORAL | Status: DC | PRN
  Filled 2017-09-27: qty 1

## 2017-09-27 MED ORDER — HYDROMORPHONE HCL 1 MG/ML IJ SOLN: 0.2500 mg | INTRAMUSCULAR | Status: DC | PRN

## 2017-09-27 MED ORDER — ACETAMINOPHEN 325 MG PO TABS: 325.0000 mg | ORAL_TABLET | ORAL | Status: DC | PRN

## 2017-09-27 MED ORDER — SUGAMMADEX SODIUM 200 MG/2ML IV SOLN
INTRAVENOUS | Status: DC | PRN
  Administered 2017-09-27: 200 mg via INTRAVENOUS

## 2017-09-27 MED ORDER — OXYMETAZOLINE HCL 0.05 % NA SOLN
NASAL | Status: DC | PRN
Start: 2017-09-27 — End: 2017-09-27
  Administered 2017-09-27: 1

## 2017-09-27 MED ORDER — CEFAZOLIN SODIUM-DEXTROSE 1-4 GM/50ML-% IV SOLN
INTRAVENOUS | Status: DC | PRN
  Administered 2017-09-27: 2 g via INTRAVENOUS

## 2017-09-27 MED ORDER — ROCURONIUM BROMIDE 100 MG/10ML IV SOLN
INTRAVENOUS | Status: DC | PRN
  Administered 2017-09-27: 25 mg via INTRAVENOUS
  Administered 2017-09-27: 20 mg via INTRAVENOUS

## 2017-09-27 MED ORDER — FAMOTIDINE 20 MG PO TABS
ORAL_TABLET | ORAL | Status: AC
  Administered 2017-09-27: 20 mg via ORAL
  Filled 2017-09-27: qty 1

## 2017-09-27 MED ORDER — LIDOCAINE-EPINEPHRINE 1 %-1:100000 IJ SOLN
INTRAMUSCULAR | Status: AC
  Filled 2017-09-27: qty 1

## 2017-09-27 MED ORDER — FENTANYL CITRATE (PF) 100 MCG/2ML IJ SOLN
INTRAMUSCULAR | Status: AC
  Filled 2017-09-27: qty 2

## 2017-09-27 MED ORDER — FENTANYL CITRATE (PF) 100 MCG/2ML IJ SOLN
INTRAMUSCULAR | Status: DC | PRN
  Administered 2017-09-27: 50 ug via INTRAVENOUS
  Administered 2017-09-27: 100 ug via INTRAVENOUS
  Administered 2017-09-27: 50 ug via INTRAVENOUS

## 2017-09-27 MED ORDER — LIDOCAINE HCL (CARDIAC) 20 MG/ML IV SOLN
INTRAVENOUS | Status: DC | PRN
  Administered 2017-09-27: 100 mg via INTRAVENOUS

## 2017-09-27 MED ORDER — AMOXICILLIN-POT CLAVULANATE 875-125 MG PO TABS: 1.0000 | ORAL_TABLET | Freq: Two times a day (BID) | ORAL | 0 refills | Status: AC

## 2017-09-27 MED ORDER — FAMOTIDINE 20 MG PO TABS
20.0000 mg | ORAL_TABLET | Freq: Once | ORAL | Status: AC
  Administered 2017-09-27: 20 mg via ORAL

## 2017-09-27 MED ORDER — ONDANSETRON HCL 4 MG/2ML IJ SOLN
INTRAMUSCULAR | Status: DC | PRN
  Administered 2017-09-27: 4 mg via INTRAVENOUS

## 2017-09-27 MED ORDER — MIDAZOLAM HCL 2 MG/2ML IJ SOLN
INTRAMUSCULAR | Status: AC
  Filled 2017-09-27: qty 2

## 2017-09-27 MED ORDER — OXYMETAZOLINE HCL 0.05 % NA SOLN
NASAL | Status: AC
  Filled 2017-09-27: qty 15

## 2017-09-27 MED ORDER — PHENYLEPHRINE HCL 10 MG/ML IJ SOLN
INTRAMUSCULAR | Status: DC | PRN
  Administered 2017-09-27 (×2): 50 ug via INTRAVENOUS

## 2017-09-27 MED ORDER — HYDROCODONE-ACETAMINOPHEN 5-325 MG PO TABS: 1.0000 | ORAL_TABLET | ORAL | 0 refills | Status: AC | PRN

## 2017-09-27 MED ORDER — CIPROFLOXACIN-DEXAMETHASONE 0.3-0.1 % OT SUSP
OTIC | Status: AC
  Filled 2017-09-27: qty 7.5

## 2017-09-27 MED ORDER — MIDAZOLAM HCL 2 MG/2ML IJ SOLN
INTRAMUSCULAR | Status: DC | PRN
  Administered 2017-09-27: 2 mg via INTRAVENOUS

## 2017-09-27 MED ORDER — LACTATED RINGERS IV SOLN
INTRAVENOUS | Status: DC
  Administered 2017-09-27 (×3): via INTRAVENOUS

## 2017-09-27 MED ORDER — PROMETHAZINE HCL 25 MG/ML IJ SOLN: 6.2500 mg | INTRAMUSCULAR | Status: DC | PRN

## 2017-09-27 MED ORDER — DEXAMETHASONE SODIUM PHOSPHATE 10 MG/ML IJ SOLN
INTRAMUSCULAR | Status: DC | PRN
  Administered 2017-09-27: 10 mg via INTRAVENOUS

## 2017-09-27 MED ORDER — CIPROFLOXACIN-DEXAMETHASONE 0.3-0.1 % OT SUSP: 4.0000 [drp] | Freq: Two times a day (BID) | OTIC | 0 refills | Status: AC

## 2017-09-27 MED ORDER — PROPOFOL 10 MG/ML IV BOLUS
INTRAVENOUS | Status: AC
  Filled 2017-09-27: qty 20

## 2017-09-27 MED ORDER — ONDANSETRON HCL 4 MG PO TABS: 4.0000 mg | ORAL_TABLET | Freq: Three times a day (TID) | ORAL | 0 refills | Status: AC | PRN

## 2017-09-27 SURGICAL SUPPLY — 44 items
BALLN SINUPLASTY KIT 6X16 (BALLOONS) ×4
BALLOON SINUPLASTY KIT 6X16 (BALLOONS) ×2 IMPLANT
BATTERY INSTRU NAVIGATION (MISCELLANEOUS) ×8 IMPLANT
BLADE MYR LANCE NRW W/HDL (BLADE) ×4 IMPLANT
CANISTER SUC SOCK COL 7IN (MISCELLANEOUS) ×4 IMPLANT
CANISTER SUCT 1200ML W/VALVE (MISCELLANEOUS) ×4 IMPLANT
CANISTER SUCT 3000ML PPV (MISCELLANEOUS) ×4 IMPLANT
CNTNR SPEC 2.5X3XGRAD LEK (MISCELLANEOUS) ×4
COAG SUCT 10F 3.5MM HAND CTRL (MISCELLANEOUS) ×4 IMPLANT
CONT SPEC 4OZ STER OR WHT (MISCELLANEOUS) ×4
CONTAINER SPEC 2.5X3XGRAD LEK (MISCELLANEOUS) ×4 IMPLANT
COTTON BALL STRL MEDIUM (GAUZE/BANDAGES/DRESSINGS) ×4 IMPLANT
DRESSING NASL FOAM PST OP SINU (MISCELLANEOUS) ×2 IMPLANT
DRSG NASAL FOAM POST OP SINU (MISCELLANEOUS) ×4
ELECT REM PT RETURN 9FT ADLT (ELECTROSURGICAL) ×4
ELECTRODE REM PT RTRN 9FT ADLT (ELECTROSURGICAL) ×2 IMPLANT
GAUZE PACK 2X3YD (MISCELLANEOUS) ×4 IMPLANT
GLOVE BIO SURGEON STRL SZ7.5 (GLOVE) ×8 IMPLANT
GOWN STRL REUS W/ TWL LRG LVL3 (GOWN DISPOSABLE) ×4 IMPLANT
GOWN STRL REUS W/TWL LRG LVL3 (GOWN DISPOSABLE) ×4
IV NS 1000ML (IV SOLUTION) ×2
IV NS 1000ML BAXH (IV SOLUTION) ×2 IMPLANT
KIT TURNOVER KIT A (KITS) ×4 IMPLANT
NS IRRIG 500ML POUR BTL (IV SOLUTION) ×4 IMPLANT
PACK HEAD/NECK (MISCELLANEOUS) ×4 IMPLANT
PACKING NASAL EPIS 4X2.4 XEROG (MISCELLANEOUS) ×8 IMPLANT
PATTIES SURGICAL .5 X3 (DISPOSABLE) ×8 IMPLANT
SHAVER DIEGO BLD STD TYPE A (BLADE) ×4 IMPLANT
SOL ANTI-FOG 6CC FOG-OUT (MISCELLANEOUS) ×2 IMPLANT
SOL FOG-OUT ANTI-FOG 6CC (MISCELLANEOUS) ×2
SPLINT NASAL REUTER (MISCELLANEOUS) IMPLANT
SPLINT NASAL REUTER .5MM (MISCELLANEOUS) IMPLANT
SUT CHROMIC 4 0 RB 1X27 (SUTURE) ×4 IMPLANT
SUT ETHILON 3 0 PS 1 (SUTURE) ×4 IMPLANT
SWAB CULTURE AMIES ANAERIB BLU (MISCELLANEOUS) IMPLANT
SYR 30ML LL (SYRINGE) ×4 IMPLANT
TOWEL OR 17X26 4PK STRL BLUE (TOWEL DISPOSABLE) ×4 IMPLANT
TRACKER CRANIALMASK (MASK) ×4 IMPLANT
TRAP SPECIMEN MUCOUS 40CC (MISCELLANEOUS) IMPLANT
TUBE EAR ARMSTRONG HC 1.14X3.5 (OTOLOGIC RELATED) ×8 IMPLANT
TUBING CONNECTING 10 (TUBING) ×3 IMPLANT
TUBING CONNECTING 10' (TUBING) ×1
TUBING DECLOG MULTIDEBRIDER (TUBING) ×4 IMPLANT
WATER STERILE IRR 1000ML POUR (IV SOLUTION) ×4 IMPLANT

## 2017-09-27 NOTE — Op Note (Signed)
..09/27/2017  11:19 AM    Sharyn Dross  [id number]   Pre-Op Dx:  Chronic Rhinosinusitis refractory to medical treatment, Eustachian Tube dysfunction, Chronic otitis media  Post-op Dx: Same  Proc:   1)  Image Guided Sinus Surgery,  2)  Bilateral Maxillary Antrostomy  3)  Bilateral Frontal Sinusotomy  4)  Bilateral total ethmoidectomy  5)  Bilateral Myringotomy and Tympanostomy Tube Placement  Surg:  Daun Rens  Anes:  General  EBL:  28ml  Comp:  None  Findings: Bilateral purulence in maxillary sinuses, osteitic bone and inflammation through out ethmoids.  Successful frontal sinusotomy.  Bilateral mucoid effusion in ears.  Procedure: After the patient was identified in holding and the benefits of the procedure were reviewed as well as the consent and risks.  The patient was taken to the operating room and with the patient in a comfortable supine position,  general orotracheal anesthesia was induced without difficulty.  A proper time-out was performed.  The Stryker image guidance system was set up and calibrated in the normal fashion with an acceptable error of 0.78mm.       The patient next received preoperative Afrin spray for topical decongestion and vasoconstriction and 1% Xylocaine with 1:100,000 epinephrine, 5 cc's, was infiltrated into the inferior turbinates, septum, and anterior middle turbinates bilaterally.  Several minutes were allowed for this to take effect.  Cottoniod pledgets soaked in Afrin were placed into both nasal cavities and left while the patient was prepped and draped in the standard fashion.  The materials were removed from the nose and observed to be intact and correct in number.  The nose was next inspected with a zero degree endoscope and the middle turbinates were medialized and afrin soaked pledgets were placed lateral to the turbinates for approximately one minute.  At this time, attention was directed to the patient's left front sinus.  The  uncinate process was infractured with a maxillary ostia seeker.  The Acclarent balloon sinuplasty device was next placed behind the uncinate process and the guide wire was threaded into the frontal sinus with proper transillumination.  The sinus tract was then dilated 3X for a patent frontal sinus.  At this time a currette and Giraffe probe was used to remove the fractured ager nasi cells for a widely patient frontal sinus outflow tract.  This was repeated in an identical fashion on the patient's right side for a widely patent frontal sinus outflow tract bilaterally.  This was evaluated with 0 degree endoscope and fragements of residual ager nasi cells were removed with 45 degree up-biting forceps.  At this time attention was directed to the patient's maxillary sinuses.  On the right, a ball tipped probe was placed through the natural ostia and this was used to create a larger opening.  Using a Diego microdebrider, the maxillary antrostomy was enlarged for a widely patent maxillary antrostomy.  This was repeated in a simlar fashion on the patient's left side.  With the left and right sided demonstrating purulence in the floor of the maxillary sinuses.  Hemostasis was performed with topical Afrin soaked pledgets.  Visualization with a zero degree endoscope was used to examine the bilateral maxillary antrostomies which were noted to be widely patent and in continuity with the natural os bilaterally.  Next attention was directed to the patient's ethmoid sinuses.  The left ethmoid bulla was entered with a straight image guidance suction.  The ethmoid cells were opened from a medial and inferior position superior and laterally until  the vertical lamella was encountered.  This was opened and the posterior ethmoid was opened in a similar fashion.  All fragments of bone and mucosa were removed with straight biting forceps.  The skull base was identified and trauma was avoided.  Image guidance was used throughout to  ensure all diseased air cells were opened.  This was repeated on the patient's right side in a similar fashion with all ethmoid air cells opened bilaterally.  At this time with all diseased sinuses opened, the patient's nasal cavity was examinated and copiously irrigated with sterile saline.  Meticulous hemostasis was continued and all diseased sinuses were examined and noted to be widely patent.    Stamberger sinufoam was placed in the ethmoid cavity and maxillary sinus cavity bilaterally.  A regular sized Xerogel was placed lateral to the middle turbinate bilaterally.  Hemostasis was continued.  The patient's sinus cavity was irrigated with sterile saline to inflate the Xerogel.  At this time, attention was directed to the patient's bilateral myringotomy and tympanostomy tube placement.  The operative microscope was brought onto the field and an appropriate sized speculum was placed into the patient's left ear.  Cerumen was cleaned and the drum was visualized.  This demonstrated a middle ear effusion.  A radial myringotomy was placed in the anterior inferior aspect of the tympanic membrane.  A mucoid effusion was removed with size 5 suction.  A tube was placed through the myringotomy site.  This was repeated on the patient's left side except for the myringotomy was placed in the posterior inferior aspect of the tympanic membrane.   Dispo:   PACU to home  Plan: Ice, elevation, narcotic analgesia and prophylactic antibiotics.  We will reevaluate the patient in the office in 7 days.  Return to work in 7-10 days, strenuous activities in two weeks.   Tayt Moyers 09/27/2017 11:19 AM

## 2017-09-27 NOTE — Anesthesia Procedure Notes (Signed)
Procedure Name: Intubation Date/Time: 09/27/2017 9:51 AM Performed by: Justus Memory, CRNA Pre-anesthesia Checklist: Patient identified, Patient being monitored, Timeout performed, Emergency Drugs available and Suction available Patient Re-evaluated:Patient Re-evaluated prior to induction Oxygen Delivery Method: Circle system utilized Preoxygenation: Pre-oxygenation with 100% oxygen Induction Type: IV induction Ventilation: Mask ventilation without difficulty Laryngoscope Size: Mac and 3 Grade View: Grade I Tube type: Oral Tube size: 7.0 mm Number of attempts: 1 Airway Equipment and Method: Stylet Placement Confirmation: ETT inserted through vocal cords under direct vision,  positive ETCO2 and breath sounds checked- equal and bilateral Secured at: 21 cm Tube secured with: Tape Dental Injury: Teeth and Oropharynx as per pre-operative assessment

## 2017-09-27 NOTE — H&P (Signed)
..  History and Physical paper copy reviewed and updated date of procedure and will be scanned into system.  Patient seen and examined.  

## 2017-09-27 NOTE — Discharge Instructions (Signed)

## 2017-09-27 NOTE — Anesthesia Post-op Follow-up Note (Signed)
Anesthesia QCDR form completed.        

## 2017-09-27 NOTE — Transfer of Care (Signed)
Immediate Anesthesia Transfer of Care Note  Patient: Karen Cardenas  Procedure(s) Performed: IMAGE GUIDED SINUS SURGERY (N/A ) MAXILLARY ANTROSTOMY (Bilateral ) ETHMOIDECTOMY (Bilateral ) MYRINGOTOMY WITH TUBE PLACEMENT (Bilateral ) FRONTAL SINUSOTOMY (Bilateral )  Patient Location: PACU  Anesthesia Type:General  Level of Consciousness: sedated  Airway & Oxygen Therapy: Patient Spontanous Breathing and Patient connected to face mask oxygen  Post-op Assessment: Report given to RN and Post -op Vital signs reviewed and stable  Post vital signs: Reviewed and stable  Last Vitals:  Vitals:   09/27/17 1134 09/27/17 1136  BP:  120/68  Pulse:  81  Resp:  (!) 21  Temp: 36.6 C   SpO2:  100%    Last Pain:  Vitals:   09/27/17 1134  TempSrc:   PainSc: Asleep         Complications: none

## 2017-09-27 NOTE — Anesthesia Postprocedure Evaluation (Signed)
Anesthesia Post Note  Patient: ANNISA MAZZARELLA  Procedure(s) Performed: IMAGE GUIDED SINUS SURGERY (N/A ) MAXILLARY ANTROSTOMY (Bilateral ) ETHMOIDECTOMY (Bilateral ) MYRINGOTOMY WITH TUBE PLACEMENT (Bilateral ) FRONTAL SINUSOTOMY (Bilateral )  Patient location during evaluation: PACU Anesthesia Type: General Level of consciousness: awake and alert Pain management: pain level controlled Vital Signs Assessment: post-procedure vital signs reviewed and stable Respiratory status: spontaneous breathing, nonlabored ventilation, respiratory function stable and patient connected to nasal cannula oxygen Cardiovascular status: blood pressure returned to baseline and stable Postop Assessment: no apparent nausea or vomiting Anesthetic complications: no     Last Vitals:  Vitals:   09/27/17 1228 09/27/17 1259  BP:  (!) 145/59  Pulse: 66 64  Resp: 16 16  Temp:  36.6 C  SpO2: 100% 100%    Last Pain:  Vitals:   09/27/17 1259  TempSrc: Temporal  PainSc: 2                  Alphonsus Sias

## 2017-09-27 NOTE — Anesthesia Preprocedure Evaluation (Signed)
Anesthesia Evaluation  Patient identified by MRN, date of birth, ID band Patient awake    Reviewed: Allergy & Precautions, H&P , NPO status , reviewed documented beta blocker date and time   Airway Mallampati: II  TM Distance: >3 FB     Dental  (+) Teeth Intact   Pulmonary neg pulmonary ROS,    Pulmonary exam normal        Cardiovascular negative cardio ROS Normal cardiovascular exam     Neuro/Psych negative neurological ROS  negative psych ROS   GI/Hepatic negative GI ROS,   Endo/Other  negative endocrine ROS  Renal/GU negative Renal ROS  negative genitourinary   Musculoskeletal   Abdominal   Peds  Hematology negative hematology ROS (+)   Anesthesia Other Findings   Reproductive/Obstetrics                             Anesthesia Physical Anesthesia Plan  ASA: II  Anesthesia Plan: General ETT   Post-op Pain Management:    Induction:   PONV Risk Score and Plan: 3 and Propofol infusion, Ondansetron and Midazolam  Airway Management Planned:   Additional Equipment:   Intra-op Plan:   Post-operative Plan:   Informed Consent: I have reviewed the patients History and Physical, chart, labs and discussed the procedure including the risks, benefits and alternatives for the proposed anesthesia with the patient or authorized representative who has indicated his/her understanding and acceptance.   Dental Advisory Given  Plan Discussed with: CRNA  Anesthesia Plan Comments:         Anesthesia Quick Evaluation

## 2017-09-28 LAB — SURGICAL PATHOLOGY

## 2018-05-22 ENCOUNTER — Other Ambulatory Visit: Payer: Self-pay | Admitting: Family Medicine

## 2018-05-22 DIAGNOSIS — Z1231 Encounter for screening mammogram for malignant neoplasm of breast: Secondary | ICD-10-CM

## 2018-06-14 ENCOUNTER — Ambulatory Visit
Admission: RE | Admit: 2018-06-14 | Discharge: 2018-06-14 | Disposition: A | Discharge status: Home / Self Care | Payer: Medicare Other | Source: Ambulatory Visit | Attending: Family Medicine | Admitting: Family Medicine

## 2018-06-14 DIAGNOSIS — Z1231 Encounter for screening mammogram for malignant neoplasm of breast: Secondary | ICD-10-CM | POA: Diagnosis present

## 2019-06-11 ENCOUNTER — Other Ambulatory Visit: Payer: Self-pay | Admitting: Family Medicine

## 2019-06-11 DIAGNOSIS — Z1231 Encounter for screening mammogram for malignant neoplasm of breast: Secondary | ICD-10-CM

## 2019-06-17 ENCOUNTER — Other Ambulatory Visit: Payer: Self-pay

## 2019-06-17 ENCOUNTER — Ambulatory Visit
Admission: RE | Admit: 2019-06-17 | Discharge: 2019-06-17 | Disposition: A | Discharge status: Home / Self Care | Payer: Medicare Other | Source: Ambulatory Visit | Attending: Family Medicine | Admitting: Family Medicine

## 2019-06-17 DIAGNOSIS — Z1231 Encounter for screening mammogram for malignant neoplasm of breast: Secondary | ICD-10-CM | POA: Diagnosis not present

## 2020-10-29 ENCOUNTER — Other Ambulatory Visit: Payer: Self-pay | Admitting: Family Medicine

## 2020-10-29 DIAGNOSIS — Z1231 Encounter for screening mammogram for malignant neoplasm of breast: Secondary | ICD-10-CM

## 2020-11-16 ENCOUNTER — Other Ambulatory Visit: Payer: Self-pay

## 2020-11-16 ENCOUNTER — Ambulatory Visit
Admission: RE | Admit: 2020-11-16 | Discharge: 2020-11-16 | Disposition: A | Discharge status: Home / Self Care | Payer: Medicare PPO | Source: Ambulatory Visit | Attending: Family Medicine | Admitting: Family Medicine

## 2020-11-16 DIAGNOSIS — Z1231 Encounter for screening mammogram for malignant neoplasm of breast: Secondary | ICD-10-CM | POA: Insufficient documentation

## 2021-07-12 ENCOUNTER — Other Ambulatory Visit: Payer: Self-pay | Admitting: Family Medicine

## 2021-07-12 DIAGNOSIS — Z13820 Encounter for screening for osteoporosis: Secondary | ICD-10-CM

## 2021-07-12 DIAGNOSIS — Z1231 Encounter for screening mammogram for malignant neoplasm of breast: Secondary | ICD-10-CM

## 2021-11-17 ENCOUNTER — Ambulatory Visit
Admission: RE | Admit: 2021-11-17 | Discharge: 2021-11-17 | Disposition: A | Discharge status: Home / Self Care | Payer: Medicare PPO | Source: Ambulatory Visit | Attending: Family Medicine | Admitting: Family Medicine

## 2021-11-17 DIAGNOSIS — Z1231 Encounter for screening mammogram for malignant neoplasm of breast: Secondary | ICD-10-CM

## 2021-11-17 DIAGNOSIS — Z13820 Encounter for screening for osteoporosis: Secondary | ICD-10-CM

## 2021-11-17 DIAGNOSIS — Z780 Asymptomatic menopausal state: Secondary | ICD-10-CM | POA: Insufficient documentation

## 2021-11-18 ENCOUNTER — Other Ambulatory Visit: Payer: Self-pay | Admitting: Family Medicine

## 2021-11-18 DIAGNOSIS — R928 Other abnormal and inconclusive findings on diagnostic imaging of breast: Secondary | ICD-10-CM

## 2021-11-18 DIAGNOSIS — N63 Unspecified lump in unspecified breast: Secondary | ICD-10-CM

## 2021-12-08 ENCOUNTER — Other Ambulatory Visit: Payer: Self-pay | Admitting: Neurosurgery

## 2021-12-08 ENCOUNTER — Ambulatory Visit
Admission: RE | Admit: 2021-12-08 | Discharge: 2021-12-08 | Disposition: A | Discharge status: Home / Self Care | Payer: Medicare PPO | Source: Ambulatory Visit | Attending: Family Medicine | Admitting: Family Medicine

## 2021-12-08 DIAGNOSIS — R928 Other abnormal and inconclusive findings on diagnostic imaging of breast: Secondary | ICD-10-CM | POA: Insufficient documentation

## 2021-12-08 DIAGNOSIS — N63 Unspecified lump in unspecified breast: Secondary | ICD-10-CM | POA: Insufficient documentation

## 2022-11-13 ENCOUNTER — Other Ambulatory Visit: Payer: Self-pay | Admitting: Family Medicine

## 2022-11-13 DIAGNOSIS — Z1231 Encounter for screening mammogram for malignant neoplasm of breast: Secondary | ICD-10-CM

## 2022-12-01 ENCOUNTER — Ambulatory Visit
Admission: RE | Admit: 2022-12-01 | Discharge: 2022-12-01 | Disposition: A | Discharge status: Home / Self Care | Payer: Medicare PPO | Source: Ambulatory Visit | Attending: Family Medicine | Admitting: Family Medicine

## 2022-12-01 DIAGNOSIS — Z1231 Encounter for screening mammogram for malignant neoplasm of breast: Secondary | ICD-10-CM

## 2023-01-12 IMAGING — MG MM DIGITAL DIAGNOSTIC UNILAT*R* W/ TOMO W/ CAD
6 series · 6 of 18 positions shown · non-contrast
Comparison: Previous exam(s).

CLINICAL DATA: 75-year-old female presenting as a recall from
screening for possible right breast mass.

EXAM:
DIGITAL DIAGNOSTIC UNILATERAL RIGHT MAMMOGRAM WITH TOMOSYNTHESIS AND
CAD; ULTRASOUND RIGHT BREAST LIMITED
TECHNIQUE: Right digital diagnostic mammography and breast tomosynthesis was
performed. The images were evaluated with computer-aided detection.;
Targeted ultrasound examination of the right breast was performed

[R CC synth-2D]
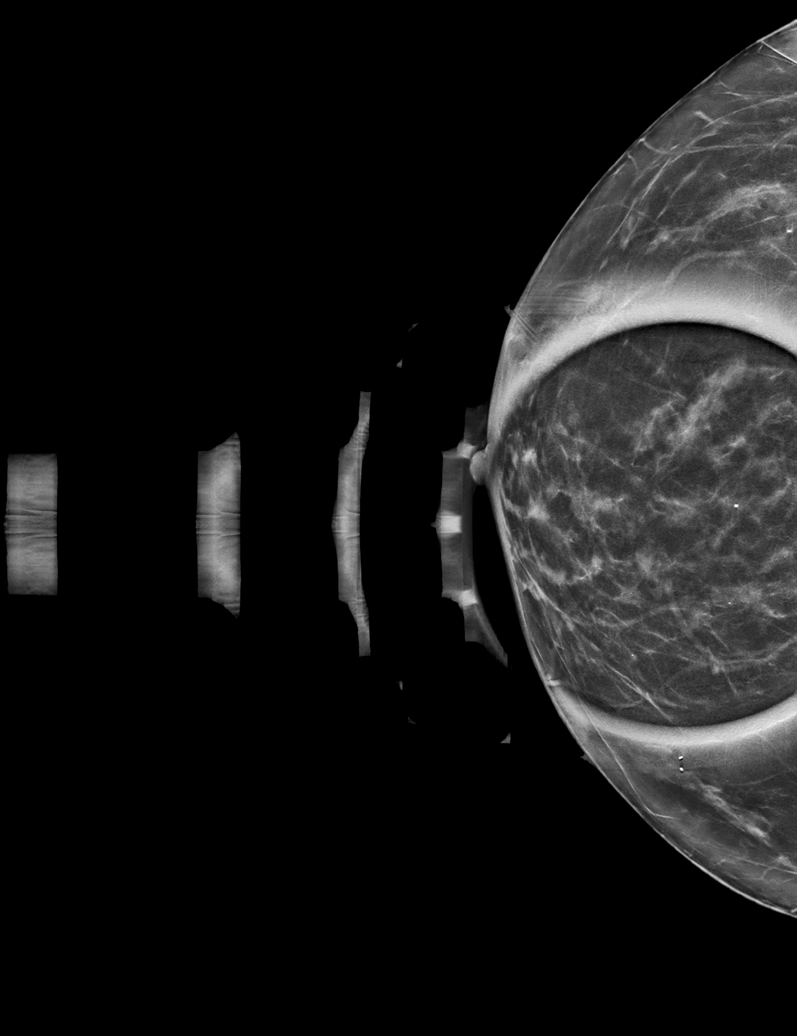

[R MLO synth-2D]
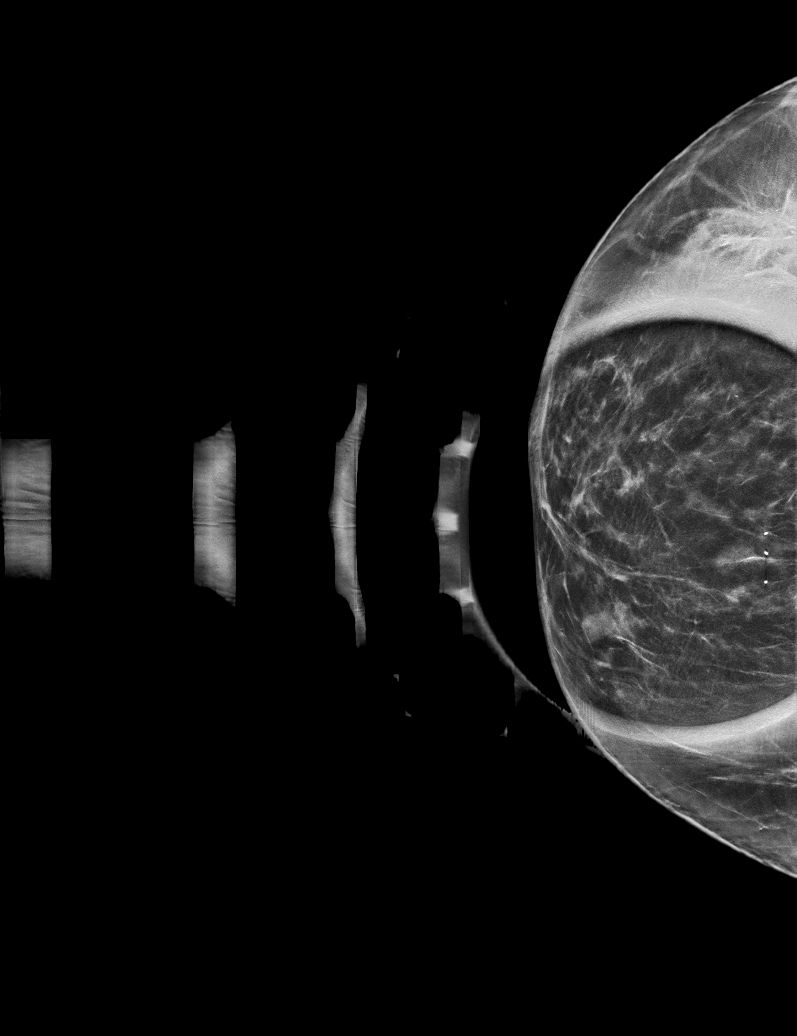

[R ML synth-2D]
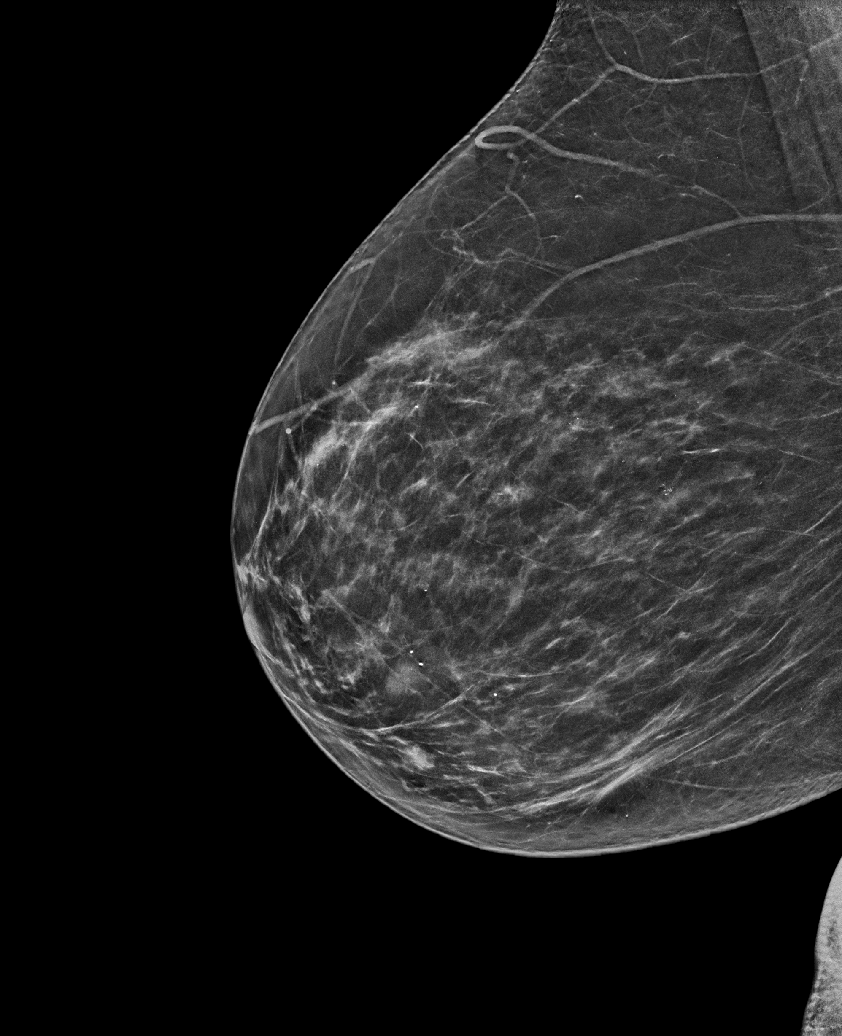

[R MLO tomo · tomo slice 31/61.0]
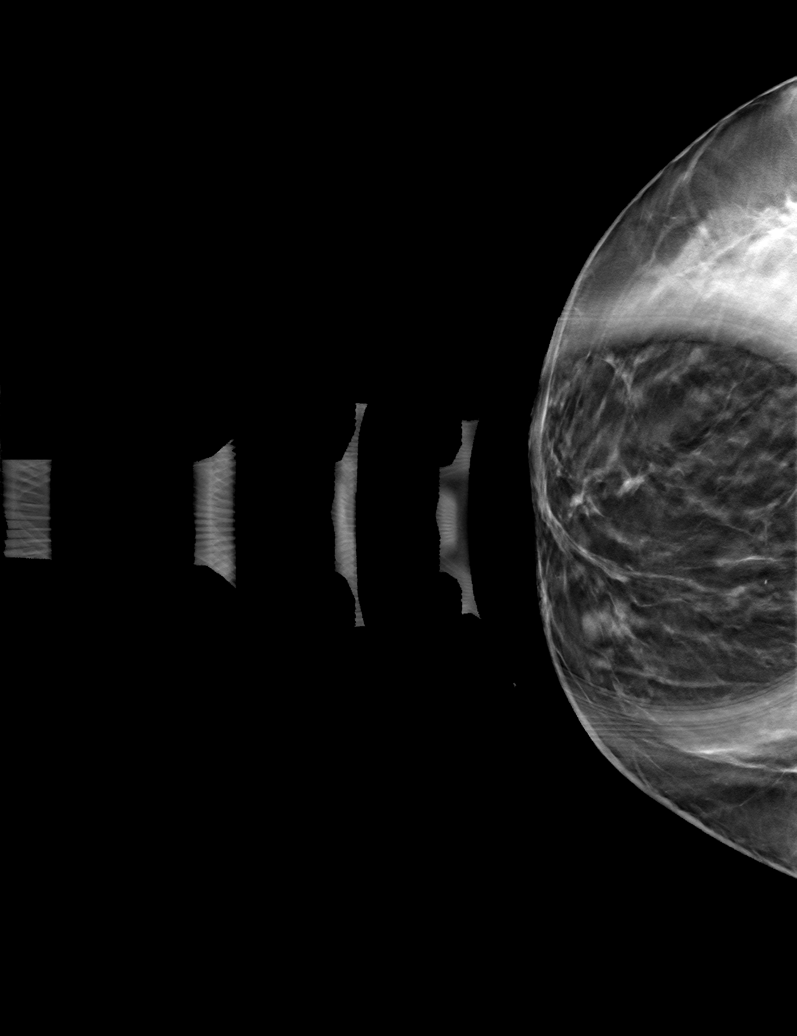

[R CC tomo · tomo slice 29/58.0]
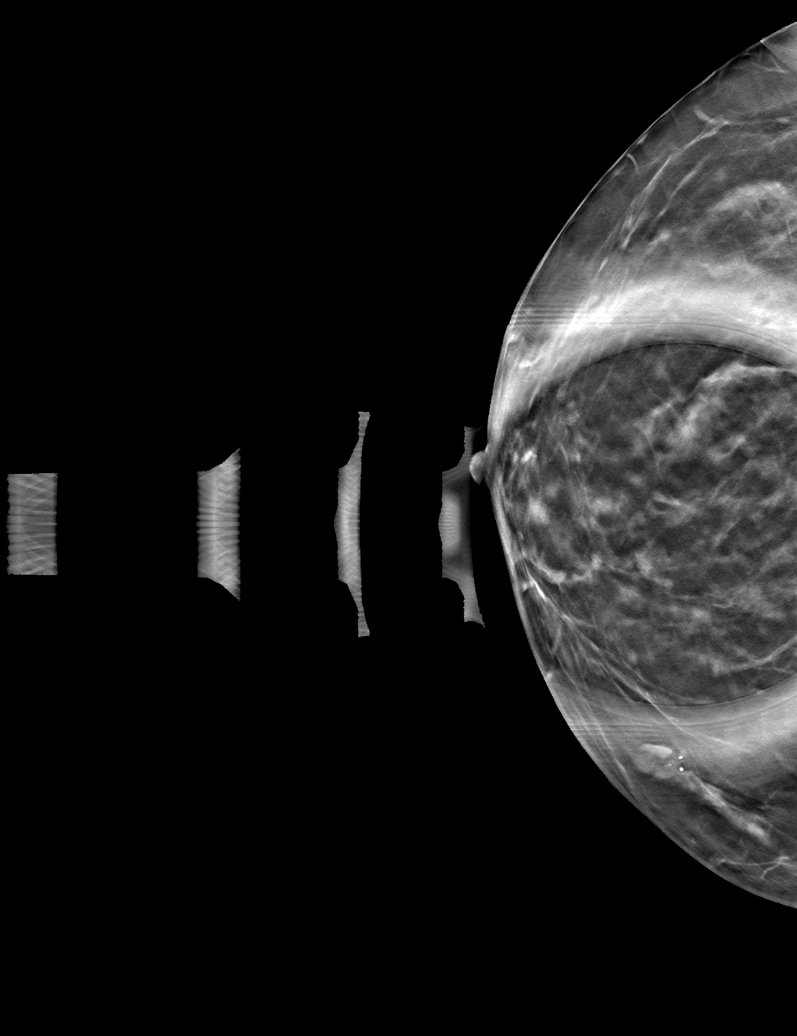

[R ML tomo · tomo slice 37/72.0]
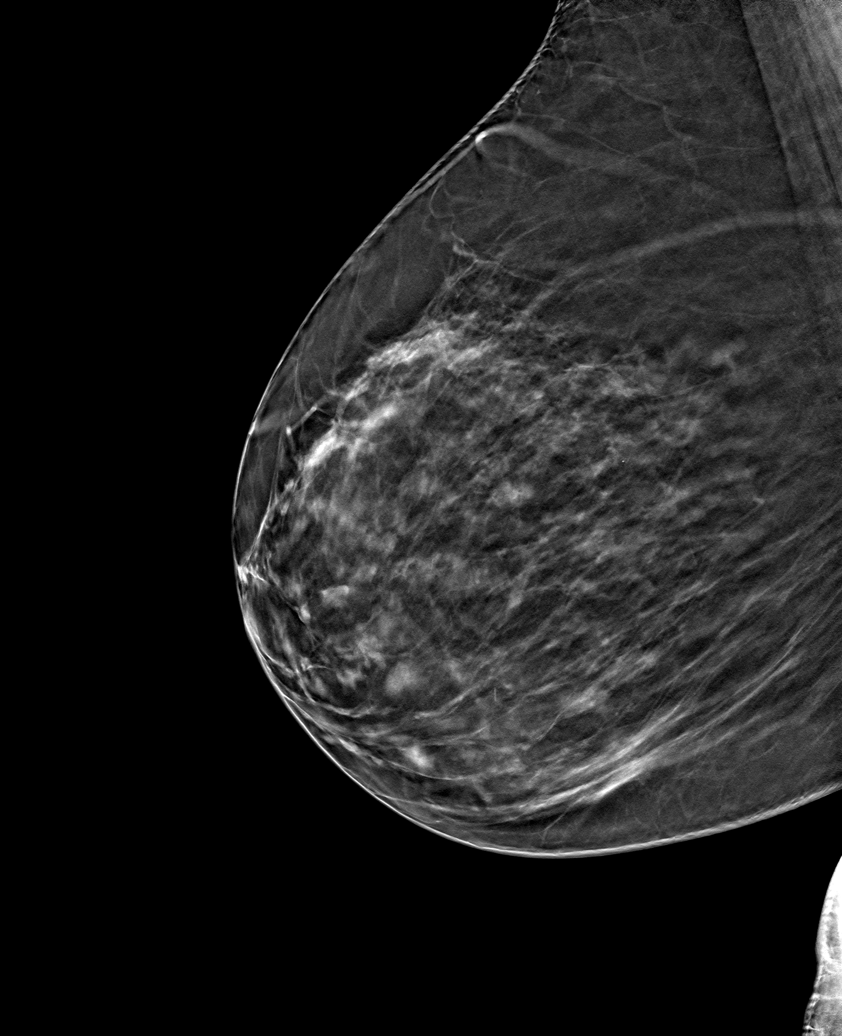

[6 of 18 positions shown; findings below may reference images not displayed]

ACR Breast Density Category b: There are scattered areas of
fibroglandular density.
FINDINGS: Mammogram:

Spot compression tomosynthesis views of the right breast were
performed demonstrating persistence of a small mass measuring
approximately 7 mm in the inferior retroareolar right breast.

Ultrasound:

Targeted ultrasound is performed in the right breast 6 o'clock 2 cm
from the nipple demonstrating an oval circumscribed anechoic mass
with thin septation measuring 0.7 x 0.4 x 0.5 cm, consistent with a
benign cluster of cysts. There is a no other tiny cyst also at 6
o'clock 2 cm from the nipple measuring 0.2 x 0.1 x 0.2 cm. No
suspicious solid mass. These correspond to the mammographic finding.
IMPRESSION: Benign cysts in the retroareolar inferior right breast.

RECOMMENDATION:
Screening mammogram in one year.(Code:[id number]-[id number])

I have discussed the findings and recommendations with the patient.
If applicable, a reminder letter will be sent to the patient
regarding the next appointment.

BI-RADS CATEGORY  2: Benign.

## 2023-11-01 ENCOUNTER — Other Ambulatory Visit: Payer: Self-pay | Admitting: Family Medicine

## 2023-11-01 DIAGNOSIS — Z1231 Encounter for screening mammogram for malignant neoplasm of breast: Secondary | ICD-10-CM

## 2023-12-04 ENCOUNTER — Ambulatory Visit
Admission: RE | Admit: 2023-12-04 | Discharge: 2023-12-04 | Disposition: A | Discharge status: Home / Self Care | Source: Ambulatory Visit | Attending: Family Medicine | Admitting: Family Medicine

## 2023-12-04 DIAGNOSIS — Z1231 Encounter for screening mammogram for malignant neoplasm of breast: Secondary | ICD-10-CM | POA: Diagnosis present
